# Patient Record
Sex: Female | Born: 1955 | Race: White | Hispanic: No | Marital: Married | Smoking: Never smoker
Health system: Southern US, Community
[De-identification: ages and names within clinical notes are randomized; demographics above are authoritative.]

## PROBLEM LIST (undated history)

## (undated) DIAGNOSIS — N200 Calculus of kidney: Secondary | ICD-10-CM

## (undated) DIAGNOSIS — N390 Urinary tract infection, site not specified: Secondary | ICD-10-CM

---

## 2001-09-07 ENCOUNTER — Emergency Department (HOSPITAL_COMMUNITY): Admission: EM | Admit: 2001-09-07 | Discharge: 2001-09-07 | Payer: Self-pay | Admitting: Emergency Medicine

## 2001-09-08 ENCOUNTER — Encounter: Payer: Self-pay | Admitting: Emergency Medicine

## 2005-05-18 ENCOUNTER — Ambulatory Visit (HOSPITAL_BASED_OUTPATIENT_CLINIC_OR_DEPARTMENT_OTHER): Admission: RE | Admit: 2005-05-18 | Discharge: 2005-05-18 | Payer: Self-pay | Admitting: Oral Surgery

## 2019-11-26 ENCOUNTER — Emergency Department (HOSPITAL_BASED_OUTPATIENT_CLINIC_OR_DEPARTMENT_OTHER): Payer: 59

## 2019-11-26 ENCOUNTER — Encounter (HOSPITAL_BASED_OUTPATIENT_CLINIC_OR_DEPARTMENT_OTHER): Payer: Self-pay

## 2019-11-26 ENCOUNTER — Emergency Department (HOSPITAL_BASED_OUTPATIENT_CLINIC_OR_DEPARTMENT_OTHER)
Admission: EM | Admit: 2019-11-26 | Discharge: 2019-11-27 | Disposition: A | Discharge status: Home / Self Care | Payer: 59 | Attending: Emergency Medicine | Admitting: Emergency Medicine

## 2019-11-26 ENCOUNTER — Other Ambulatory Visit: Payer: Self-pay

## 2019-11-26 DIAGNOSIS — Z91013 Allergy to seafood: Secondary | ICD-10-CM | POA: Insufficient documentation

## 2019-11-26 DIAGNOSIS — N200 Calculus of kidney: Secondary | ICD-10-CM

## 2019-11-26 DIAGNOSIS — R1032 Left lower quadrant pain: Secondary | ICD-10-CM | POA: Diagnosis present

## 2019-11-26 DIAGNOSIS — N132 Hydronephrosis with renal and ureteral calculous obstruction: Secondary | ICD-10-CM | POA: Insufficient documentation

## 2019-11-26 HISTORY — DX: Calculus of kidney: N20.0

## 2019-11-26 HISTORY — DX: Urinary tract infection, site not specified: N39.0

## 2019-11-26 LAB — CBC WITH DIFFERENTIAL/PLATELET
Abs Immature Granulocytes: 0.02 10*3/uL (ref 0.00–0.07)
Basophils Absolute: 0 10*3/uL (ref 0.0–0.1)
Basophils Relative: 0 %
Eosinophils Absolute: 0.1 10*3/uL (ref 0.0–0.5)
Eosinophils Relative: 2 %
HCT: 40.5 % (ref 36.0–46.0)
Hemoglobin: 13.6 g/dL (ref 12.0–15.0)
Immature Granulocytes: 0 %
Lymphocytes Relative: 14 %
Lymphs Abs: 1 10*3/uL (ref 0.7–4.0)
MCH: 30 pg (ref 26.0–34.0)
MCHC: 33.6 g/dL (ref 30.0–36.0)
MCV: 89.4 fL (ref 80.0–100.0)
Monocytes Absolute: 0.5 10*3/uL (ref 0.1–1.0)
Monocytes Relative: 7 %
Neutro Abs: 5.7 10*3/uL (ref 1.7–7.7)
Neutrophils Relative %: 77 %
Platelets: 231 10*3/uL (ref 150–400)
RBC: 4.53 MIL/uL (ref 3.87–5.11)
RDW: 12.3 % (ref 11.5–15.5)
WBC: 7.3 10*3/uL (ref 4.0–10.5)
nRBC: 0 % (ref 0.0–0.2)

## 2019-11-26 LAB — BASIC METABOLIC PANEL
Anion gap: 9 (ref 5–15)
BUN: 16 mg/dL (ref 8–23)
CO2: 22 mmol/L (ref 22–32)
Calcium: 9.3 mg/dL (ref 8.9–10.3)
Chloride: 104 mmol/L (ref 98–111)
Creatinine, Ser: 0.81 mg/dL (ref 0.44–1.00)
GFR calc Af Amer: >60 mL/min (ref >60)
GFR calc non Af Amer: >60 mL/min (ref >60)
Glucose, Bld: 119 mg/dL — ABNORMAL HIGH (ref 70–99)
Potassium: 3.6 mmol/L (ref 3.5–5.1)
Sodium: 135 mmol/L (ref 135–145)

## 2019-11-26 LAB — URINALYSIS, ROUTINE W REFLEX MICROSCOPIC
Bilirubin Urine: NEGATIVE
Glucose, UA: NEGATIVE mg/dL
Ketones, ur: 15 mg/dL — AB
Nitrite: NEGATIVE
Protein, ur: NEGATIVE mg/dL
Specific Gravity, Urine: 1.02 (ref 1.005–1.030)
pH: 6 (ref 5.0–8.0)

## 2019-11-26 LAB — URINALYSIS, MICROSCOPIC (REFLEX)

## 2019-11-26 MED ORDER — OXYCODONE-ACETAMINOPHEN 5-325 MG PO TABS: 1.0000 | ORAL_TABLET | Freq: Three times a day (TID) | ORAL | 0 refills | Status: AC | PRN

## 2019-11-26 MED ORDER — ONDANSETRON 4 MG PO TBDP: 4.0000 mg | ORAL_TABLET | Freq: Three times a day (TID) | ORAL | 0 refills | Status: AC | PRN

## 2019-11-26 MED ORDER — KETOROLAC TROMETHAMINE 30 MG/ML IJ SOLN
15.0000 mg | Freq: Once | INTRAMUSCULAR | Status: AC
  Administered 2019-11-26: 20:00:00 15 mg via INTRAVENOUS
  Filled 2019-11-26: qty 1

## 2019-11-26 MED ORDER — HYDROMORPHONE HCL 1 MG/ML IJ SOLN
0.5000 mg | Freq: Once | INTRAMUSCULAR | Status: AC
  Administered 2019-11-26: 0.5 mg via INTRAVENOUS
  Filled 2019-11-26: qty 1

## 2019-11-26 NOTE — ED Triage Notes (Signed)
Pt c/o right side abd pain started last night-urinary retention today-states pain feels same as when she has a kidney stone-NAD-steady gait

## 2019-11-26 NOTE — ED Provider Notes (Signed)
[facility] EMERGENCY DEPARTMENT Provider Note   CSN: [id number] Arrival date & time: 11/26/19  1701     History Chief Complaint  Patient presents with  . Abdominal Pain    Tiffany Day is a 64 y.o. female.  HPI Patient presents with right flank/abdominal pain.  Began last night.  Some decreased urination.  States it feels like previous kidney stones.  Last stone was around 4 years ago.  Has not passed stones yet and was told she had still had 2 stones at that time.  Has had decreased urination but states she was able to urinate in the waiting room.  States the pain increased after that.  Pain is moved from upper abdomen to lower abdomen.  No fevers or chills.  No nausea or vomiting.-    Past Medical History:  Diagnosis Date  . Kidney stone   . UTI (urinary tract infection)     There are no problems to display for this patient.   History reviewed. No pertinent surgical history.   OB History   No obstetric history on file.     No family history on file.  Social History   Tobacco Use  . Smoking status: Never Smoker  . Smokeless tobacco: Never Used  Substance Use Topics  . Alcohol use: Yes    Comment: occ  . Drug use: Never    Home Medications Prior to Admission medications   Not on File    Allergies    Shellfish allergy  Review of Systems   Review of Systems  Constitutional: Negative for appetite change.  HENT: Negative for congestion.   Respiratory: Negative for shortness of breath.   Cardiovascular: Negative for chest pain.  Gastrointestinal: Positive for abdominal pain.  Genitourinary: Positive for difficulty urinating and flank pain.  Musculoskeletal: Positive for back pain.  Skin: Negative for rash.  Neurological: Negative for weakness.  Psychiatric/Behavioral: Negative for confusion.    Physical Exam Updated Vital Signs BP 125/80 (BP Location: Right Arm)   Pulse 88   Temp 98.2 F (36.8 C) (Oral)   Resp 18   Ht 5\' 7"  (1.702  m)   Wt 63.5 kg   SpO2 99%   BMI 21.93 kg/m   Physical Exam Vitals and nursing note reviewed.  HENT:     Head: Normocephalic.  Cardiovascular:     Rate and Rhythm: Normal rate and regular rhythm.  Chest:     Chest wall: No tenderness.  Abdominal:     General: There is no abdominal bruit.     Tenderness: There is no right CVA tenderness or left CVA tenderness.  Genitourinary:    Comments: No CVA tenderness. Skin:    General: Skin is warm.  Neurological:     Mental Status: She is alert.     ED Results / Procedures / Treatments   Labs (all labs ordered are listed, but only abnormal results are displayed) Labs Reviewed  URINALYSIS, ROUTINE W REFLEX MICROSCOPIC - Abnormal; Notable for the following components:      Result Value   Hgb urine dipstick MODERATE (*)    Ketones, ur 15 (*)    Leukocytes,Ua TRACE (*)    All other components within normal limits  URINALYSIS, MICROSCOPIC (REFLEX) - Abnormal; Notable for the following components:   Bacteria, UA RARE (*)    All other components within normal limits  BASIC METABOLIC PANEL - Abnormal; Notable for the following components:   Glucose, Bld 119 (*)    All other  components within normal limits  CBC WITH DIFFERENTIAL/PLATELET    EKG None  Radiology DG Abdomen 1 View  Result Date: 11/26/2019 CLINICAL DATA:  Flank pain. EXAM: ABDOMEN - 1 VIEW COMPARISON:  None. FINDINGS: The bowel gas pattern is normal. No radio-opaque calculi or other significant radiographic abnormality are seen. There are few calcifications in the left hemipelvis which may related to calcified fibroids. IMPRESSION: Negative. Electronically Signed   By: Constance Holster M.D.   On: 11/26/2019 20:54   US Renal  Result Date: 11/26/2019 CLINICAL DATA:  Right-sided flank pain EXAM: RENAL / URINARY TRACT ULTRASOUND COMPLETE COMPARISON:  None. FINDINGS: Right Kidney: Renal measurements: 10.6 x 4.2 x 5.4 cm = volume: 126 mL. There is moderate right-sided  hydroureteronephrosis. Left Kidney: Renal measurements: 10.8 x 4.9 x 4.3 cm = volume: 126. mL. Echogenicity within normal limits. No mass or hydronephrosis visualized. Bladder: The ureteral jets were not visualized. Other: None. IMPRESSION: 1. Moderate right-sided hydroureteronephrosis. 2. Unremarkable appearance of the left kidney. 3. The bilateral ureteral jets were not visualized. Electronically Signed   By: Constance Holster M.D.   On: 11/26/2019 21:09    Procedures Procedures (including critical care time)  Medications Ordered in ED Medications  ketorolac (TORADOL) 30 MG/ML injection 15 mg (15 mg Intravenous Given 11/26/19 2007)  HYDROmorphone (DILAUDID) injection 0.5 mg (0.5 mg Intravenous Given 11/26/19 2240)    ED Course  I have reviewed the triage vital signs and the nursing notes.  Pertinent labs & imaging results that were available during my care of the patient were reviewed by me and considered in my medical decision making (see chart for details).    MDM Rules/Calculators/A&P                      Patient with right flank pain.  History of kidney stones.  States this feels the same.  Has blood in the urine without infection.  Feels somewhat better after treatment.  Ultrasound done showed hydronephrosis.  KUB did not show a stone.  Good kidney function.  I think patient stable for discharge with urology follow-up.  Will discharge. Final Clinical Impression(s) / ED Diagnoses Final diagnoses:  Kidney stone on right side    Rx / DC Orders ED Discharge Orders    None       Davonna Belling, MD 11/26/19 2329

## 2021-08-10 IMAGING — CR DG ABDOMEN 1V
1 series · 1 of 1 positions shown · non-contrast
Comparison: None.

CLINICAL DATA: Flank pain.

EXAM:
ABDOMEN - 1 VIEW

[t abdomen supine]
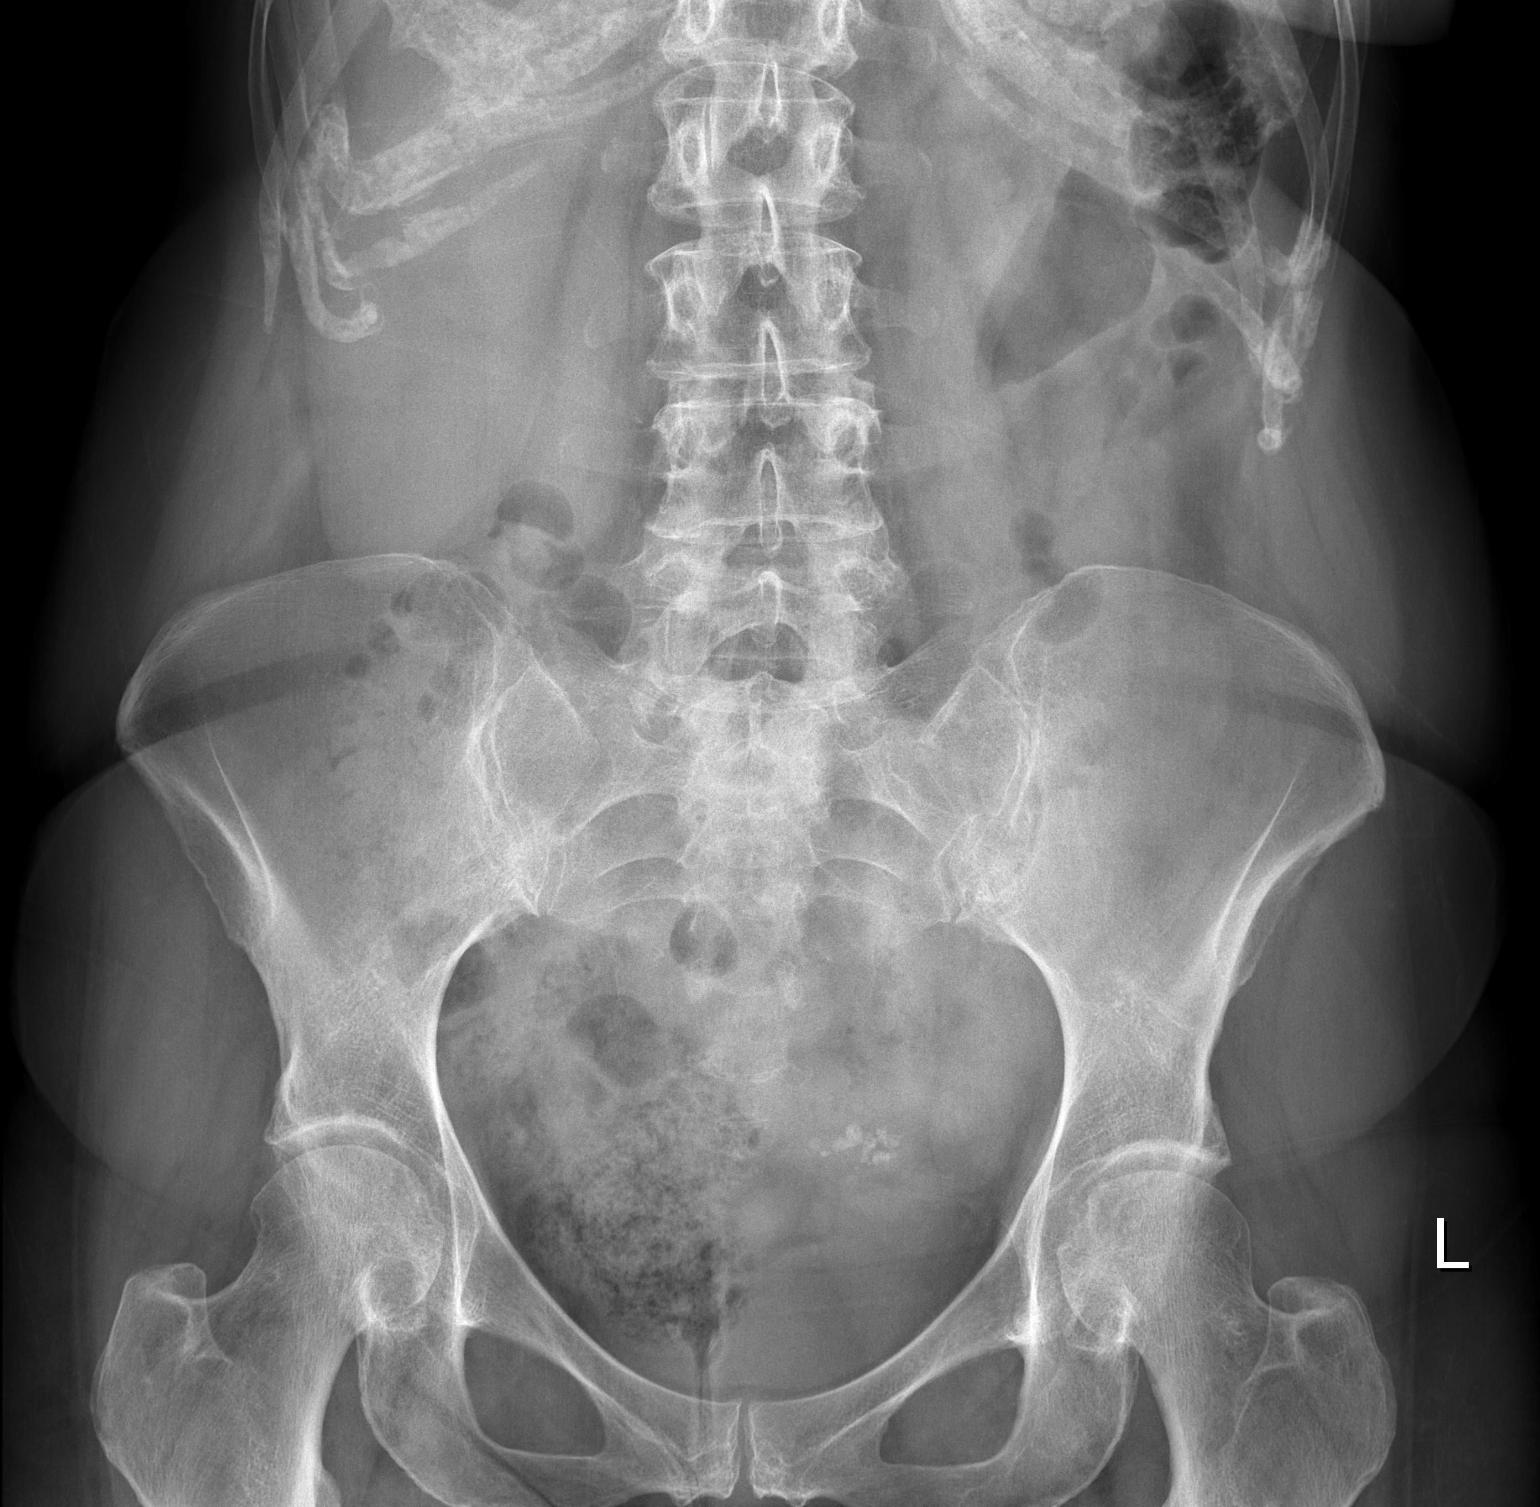

[1 of 1 positions shown; findings below may reference images not displayed]

FINDINGS: The bowel gas pattern is normal. No radio-opaque calculi or other
significant radiographic abnormality are seen. There are few
calcifications in the left hemipelvis which may related to calcified
fibroids.
IMPRESSION: Negative.

## 2021-08-10 IMAGING — US US RENAL
1 series · 14 of 25 positions shown · non-contrast
Comparison: None.

CLINICAL DATA: Right-sided flank pain

EXAM:
RENAL / URINARY TRACT ULTRASOUND COMPLETE

[Series 1: us renal · 14 of 33 slices shown]
[im 1/33]
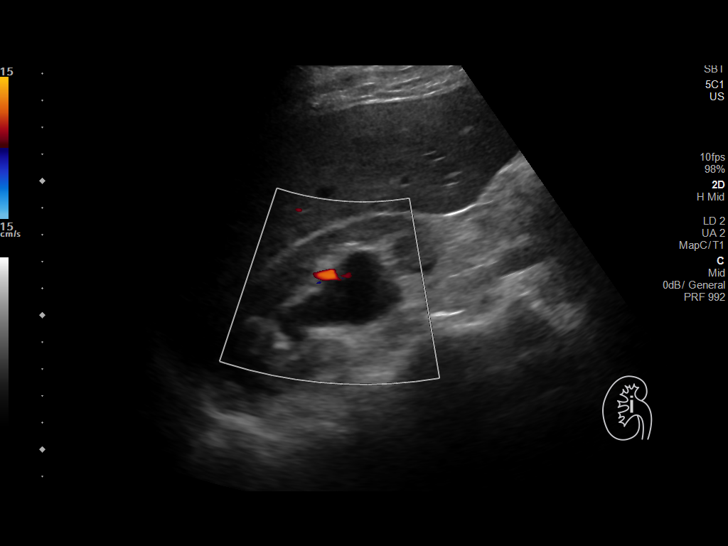
[im 3/33]
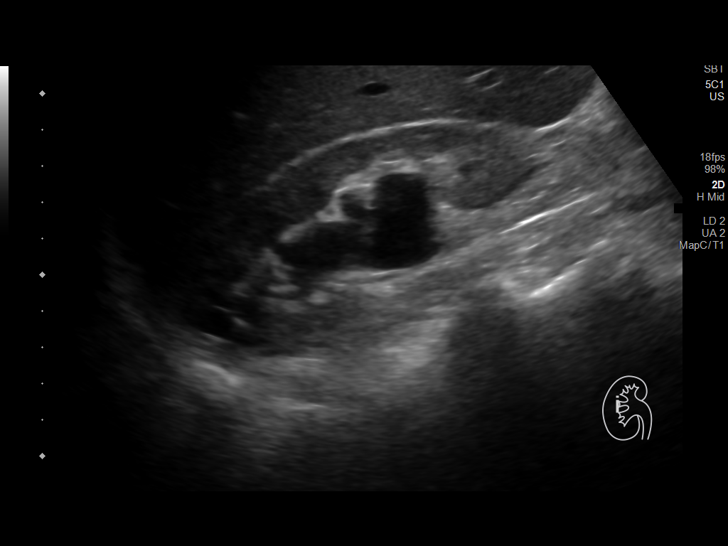
[im 6/33]
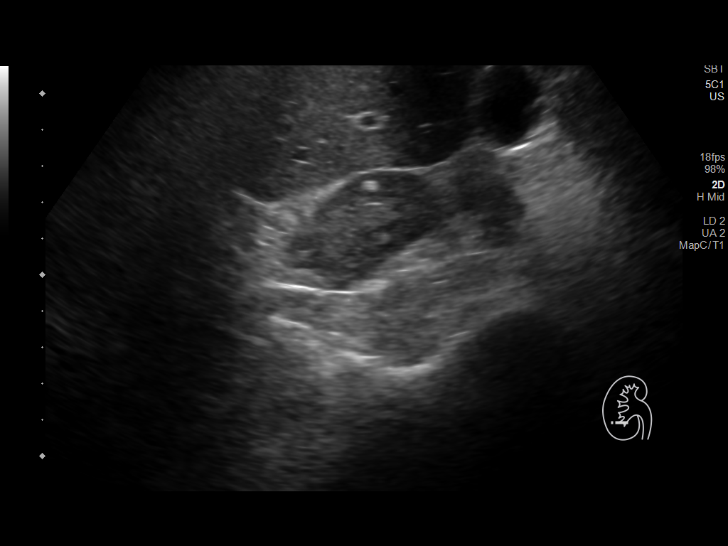
[im 9/33]
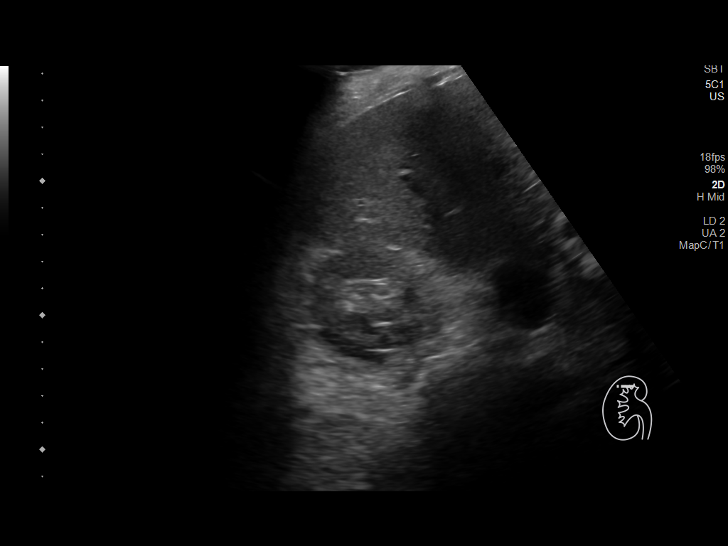
[im 11/33]
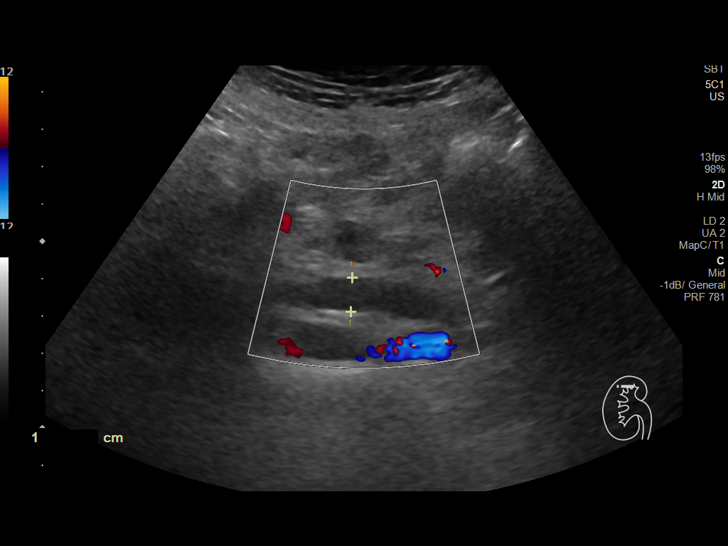
[im 13/33]
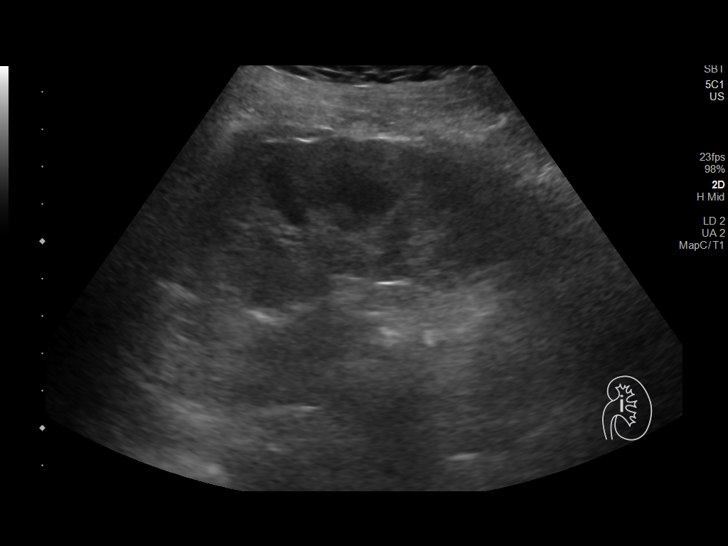
[im 15/33]
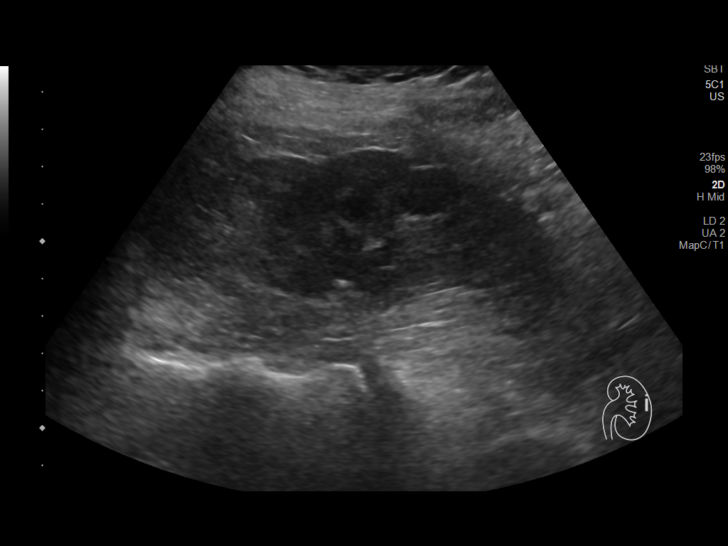
[im 18/33]
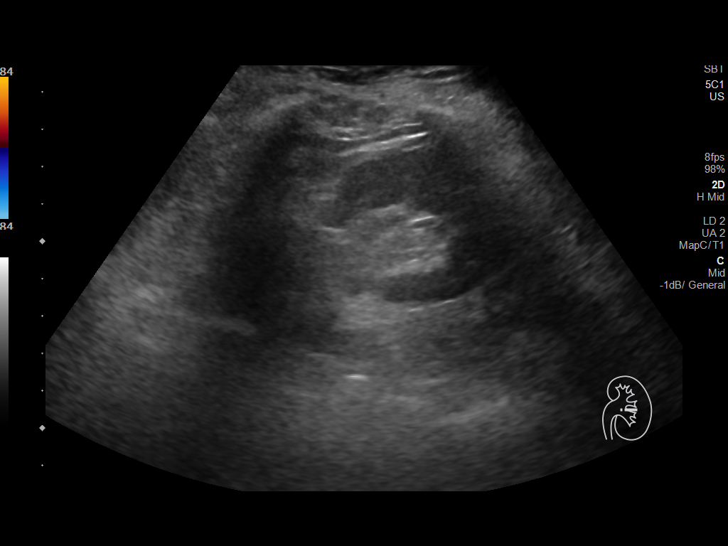
[im 21/33]
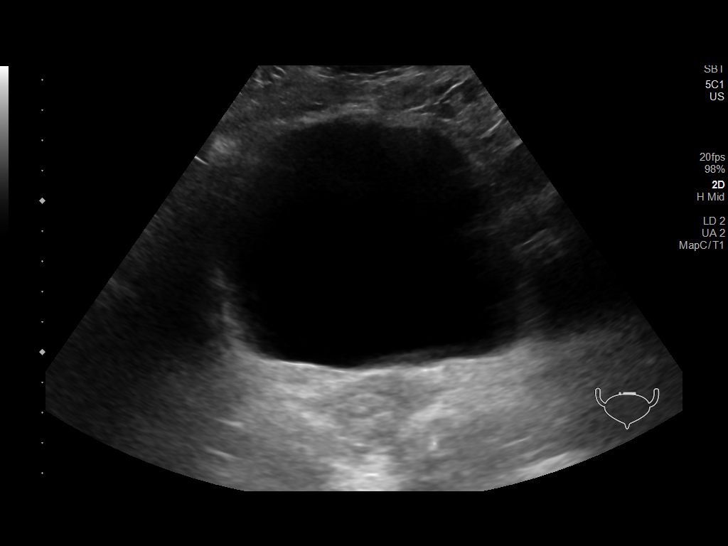
[im 22/33]
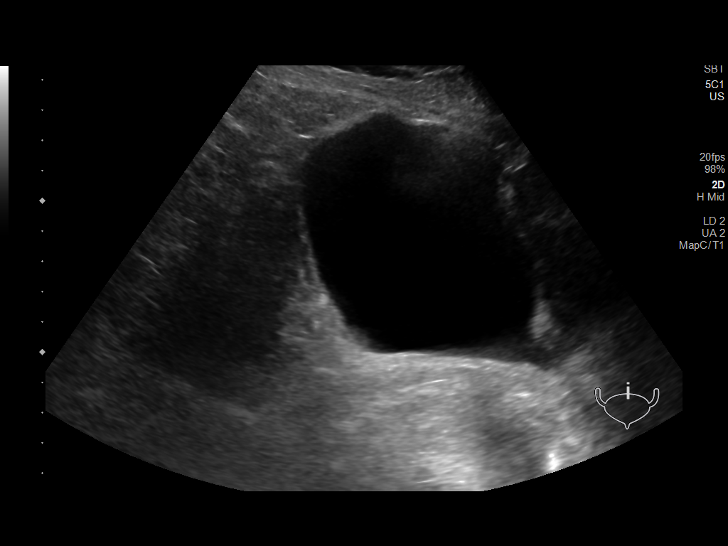
[im 25/33]
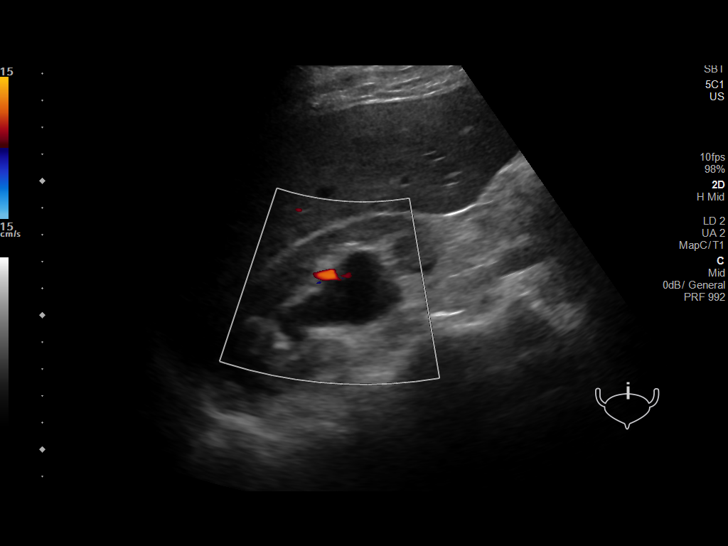
[im 27/33]
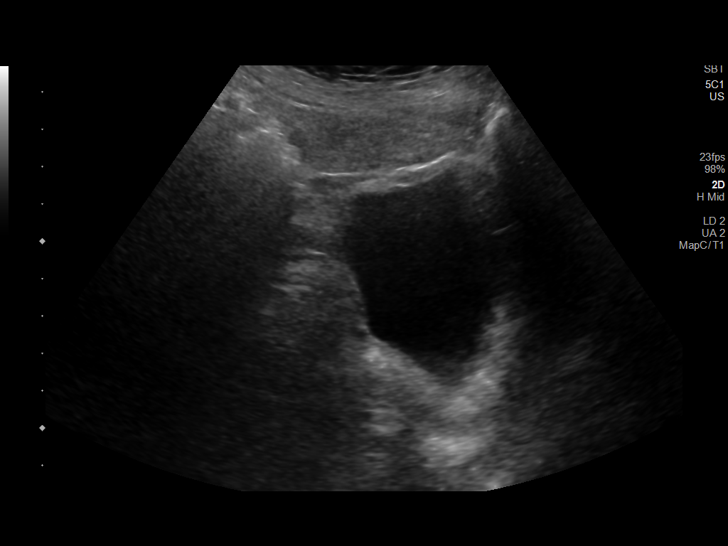
[im 30/33]
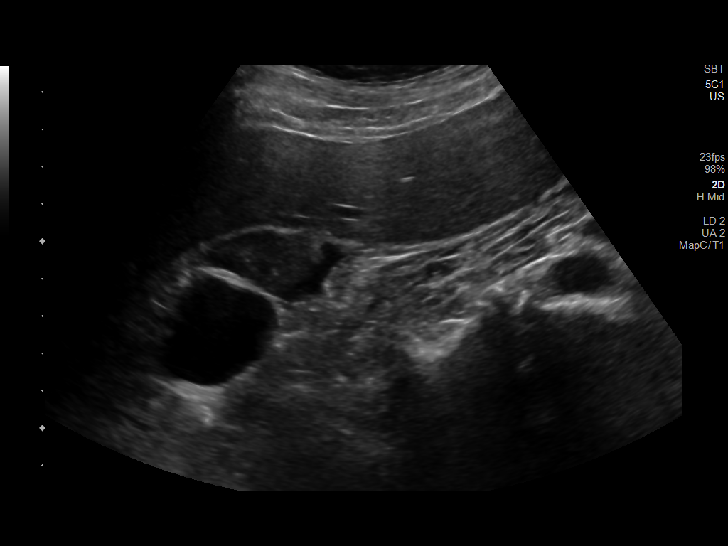
[im 33/33]
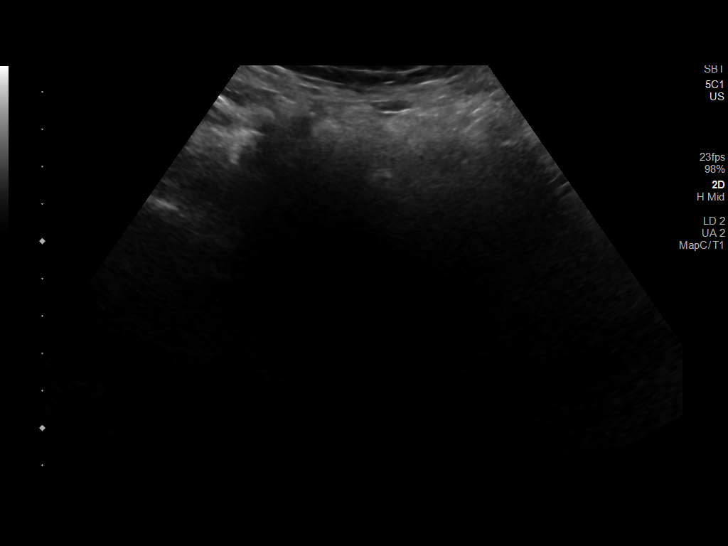

[14 of 25 positions shown; findings below may reference images not displayed]

FINDINGS: Right Kidney:

Renal measurements: 10.6 x 4.2 x 5.4 cm = volume: 126 mL. There is
moderate right-sided hydroureteronephrosis.

Left Kidney:

Renal measurements: 10.8 x 4.9 x 4.3 cm = volume: 126. mL.
Echogenicity within normal limits. No mass or hydronephrosis
visualized.

Bladder:

The ureteral jets were not visualized.

Other:

None.
IMPRESSION: 1. Moderate right-sided hydroureteronephrosis.
2. Unremarkable appearance of the left kidney.
3. The bilateral ureteral jets were not visualized.
